# Patient Record
Sex: Female | Born: 1960 | Race: White | Hispanic: No | Marital: Single | Smoking: Current every day smoker
Health system: Southern US, Community
[De-identification: ages and names within clinical notes are randomized; demographics above are authoritative.]

---

## 2009-06-18 ENCOUNTER — Emergency Department (HOSPITAL_COMMUNITY): Admission: EM | Admit: 2009-06-18 | Discharge: 2009-06-19 | Payer: Self-pay | Admitting: Emergency Medicine

## 2009-07-11 ENCOUNTER — Encounter: Admission: RE | Admit: 2009-07-11 | Discharge: 2009-10-09 | Payer: Self-pay | Admitting: Orthopaedic Surgery

## 2016-02-07 ENCOUNTER — Encounter (HOSPITAL_COMMUNITY): Payer: Self-pay | Admitting: *Deleted

## 2016-02-07 ENCOUNTER — Emergency Department (HOSPITAL_COMMUNITY): Payer: BLUE CROSS/BLUE SHIELD

## 2016-02-07 ENCOUNTER — Emergency Department (HOSPITAL_COMMUNITY)
Admission: EM | Admit: 2016-02-07 | Discharge: 2016-02-07 | Disposition: A | Discharge status: Home / Self Care | Payer: BLUE CROSS/BLUE SHIELD | Attending: Emergency Medicine | Admitting: Emergency Medicine

## 2016-02-07 DIAGNOSIS — S42294A Other nondisplaced fracture of upper end of right humerus, initial encounter for closed fracture: Secondary | ICD-10-CM | POA: Insufficient documentation

## 2016-02-07 DIAGNOSIS — Y92009 Unspecified place in unspecified non-institutional (private) residence as the place of occurrence of the external cause: Secondary | ICD-10-CM | POA: Insufficient documentation

## 2016-02-07 DIAGNOSIS — Y999 Unspecified external cause status: Secondary | ICD-10-CM | POA: Insufficient documentation

## 2016-02-07 DIAGNOSIS — F17200 Nicotine dependence, unspecified, uncomplicated: Secondary | ICD-10-CM | POA: Insufficient documentation

## 2016-02-07 DIAGNOSIS — W010XXA Fall on same level from slipping, tripping and stumbling without subsequent striking against object, initial encounter: Secondary | ICD-10-CM | POA: Diagnosis not present

## 2016-02-07 DIAGNOSIS — S42201A Unspecified fracture of upper end of right humerus, initial encounter for closed fracture: Secondary | ICD-10-CM

## 2016-02-07 DIAGNOSIS — Z79899 Other long term (current) drug therapy: Secondary | ICD-10-CM | POA: Diagnosis not present

## 2016-02-07 DIAGNOSIS — S4991XA Unspecified injury of right shoulder and upper arm, initial encounter: Secondary | ICD-10-CM | POA: Diagnosis present

## 2016-02-07 DIAGNOSIS — Y939 Activity, unspecified: Secondary | ICD-10-CM | POA: Diagnosis not present

## 2016-02-07 MED ORDER — OXYCODONE-ACETAMINOPHEN 5-325 MG PO TABS
1.0000 | ORAL_TABLET | Freq: Once | ORAL | Status: AC
  Administered 2016-02-07: 1 via ORAL
  Filled 2016-02-07: qty 1

## 2016-02-07 MED ORDER — OXYCODONE-ACETAMINOPHEN 5-325 MG PO TABS: 1.0000 | ORAL_TABLET | ORAL | 0 refills | Status: AC | PRN

## 2016-02-07 NOTE — ED Triage Notes (Signed)
Pt c/o right shoulder pain after falling last night on the ice. Denies hitting her head. Pt feels as if her right shoulder has "popped out".

## 2016-02-07 NOTE — ED Provider Notes (Signed)
[facility]-EM[facility]ENCY DEPT Provider Note   CSN: [id number] Arrival date & time: 02/07/16  1215     History   Chief Complaint Chief Complaint  Patient presents with  . Shoulder Injury    HPI Maria Walter is a 56 y.o. female.  She is here for evaluation of the right shoulder injury, which occurred last night. She describes slipping onset no, falling against a car with an isolated injury to her right shoulder. She denies headache, neck pain, back pain, paresthesias, weakness or dizziness. There are no other known modifying factors.  HPI  History reviewed. No pertinent past medical history.  There are no active problems to display for this patient.   Past Surgical History:  Procedure Laterality Date  . CESAREAN SECTION      OB History    No data available       Home Medications    Prior to Admission medications   Medication Sig Start Date End Date Taking? Authorizing Provider  acetaminophen (TYLENOL) 500 MG tablet Take 1,000 mg by mouth every 6 (six) hours as needed for moderate pain or fever.   Yes Historical Provider, MD  Ascorbic Acid (VITAMIN C PO) Take 1 tablet by mouth daily.   Yes Historical Provider, MD  glucosamine-chondroitin 500-400 MG tablet Take 1 tablet by mouth daily.   Yes Historical Provider, MD  ibuprofen (ADVIL,MOTRIN) 200 MG tablet Take 400-800 mg by mouth every 6 (six) hours as needed for moderate pain.   Yes Historical Provider, MD  VITAMIN E PO Take 1 tablet by mouth daily.   Yes Historical Provider, MD  oxyCODONE-acetaminophen (PERCOCET) 5-325 MG tablet Take 1 tablet by mouth every 4 (four) hours as needed for severe pain. 02/07/16   Mancel Bale, MD    Family History No family history on file.  Social History Social History  Substance Use Topics  . Smoking status: Current Every Day Smoker    Packs/day: 1.00  . Smokeless tobacco: Never Used  . Alcohol use Yes     Comment: couple glasses of wine weely      Allergies    Penicillins   Review of Systems Review of Systems  All other systems reviewed and are negative.    Physical Exam Updated Vital Signs BP (!) 182/103 (BP Location: Left Arm)   Pulse 80   Temp 98.5 F (36.9 C) (Oral)   Resp 24   Ht 5\' 1"  (1.549 m)   Wt 158 lb (71.7 kg)   SpO2 98%   BMI 29.85 kg/m   Physical Exam  Constitutional: She is oriented to person, place, and time. She appears well-developed and well-nourished. No distress.  HENT:  Head: Normocephalic and atraumatic.  Eyes: Conjunctivae and EOM are normal. Pupils are equal, round, and reactive to light.  Neck: Normal range of motion and phonation normal. Neck supple.  Cardiovascular: Normal rate.   Pulmonary/Chest: Effort normal.  Musculoskeletal:  Right shoulder tender without deformity. Neurovascular intact distally in the right hand.  Neurological: She is alert and oriented to person, place, and time. She exhibits normal muscle tone.  Skin: Skin is warm and dry.  Psychiatric: She has a normal mood and affect. Her behavior is normal. Judgment and thought content normal.  Nursing note and vitals reviewed.    ED Treatments / Results  Labs (all labs ordered are listed, but only abnormal results are displayed) Labs Reviewed - No data to display  EKG  EKG Interpretation None       Radiology Dg  Shoulder Right  Result Date: 02/07/2016 CLINICAL DATA:  Pt states she fell last night at home/pain upper right shoulder EXAM: RIGHT SHOULDER - 2+ VIEW COMPARISON:  None. FINDINGS: There is a fracture the proximal humerus. There is a transverse fracture across metaphysis with a secondary fracture across the base of the greater tuberosity. There is no significant fracture displacement or angulation. Glenohumeral and AC joints are normally aligned. IMPRESSION: Mildly comminuted, nondisplaced and nonangulated fracture of the proximal right humerus. No dislocation. Electronically Signed   By: Amie Portlandavid  Ormond M.D.   On:  02/07/2016 13:10    Procedures Procedures (including critical care time)  Medications Ordered in ED Medications  oxyCODONE-acetaminophen (PERCOCET/ROXICET) 5-325 MG per tablet 1 tablet (not administered)     Initial Impression / Assessment and Plan / ED Course  I have reviewed the triage vital signs and the nursing notes.  Pertinent labs & imaging results that were available during my care of the patient were reviewed by me and considered in my medical decision making (see chart for details).     Medications  oxyCODONE-acetaminophen (PERCOCET/ROXICET) 5-325 MG per tablet 1 tablet (1 tablet Oral Given 02/07/16 1417)    Patient Vitals for the past 24 hrs:  BP Temp Temp src Pulse Resp SpO2 Height Weight  02/07/16 1245 (!) 182/103 98.5 F (36.9 C) Oral 80 24 98 % 5\' 1"  (1.549 m) 158 lb (71.7 kg)    2:17 PM Reevaluation with update and discussion. After initial assessment and treatment, an updated evaluation reveals No change in clinical status. Findings discussed with the patient and all questions were answered. Carlus Stay L    Final Clinical Impressions(s) / ED Diagnoses   Final diagnoses:  Closed fracture of proximal end of right humerus, initial encounter    Slip and fall with isolated injury to proximal humerus. She has a nonoperative humerus fracture. Doubt occult head or spine injury.  Nursing Notes Reviewed/ Care Coordinated Applicable Imaging Reviewed Interpretation of Laboratory Data incorporated into ED treatment  The patient appears reasonably screened and/or stabilized for discharge and I doubt any other medical condition or other [facility] requiring further screening, evaluation, or treatment in the ED at this time prior to discharge.  Plan: Home Medications- continue; Home Treatments- rest; return here if the recommended treatment, does not improve the symptoms; Recommended follow up- PCP prn   New Prescriptions New Prescriptions   OXYCODONE-ACETAMINOPHEN  (PERCOCET) 5-325 MG TABLET    Take 1 tablet by mouth every 4 (four) hours as needed for severe pain.     Mancel BaleElliott Kataleya Zaugg, MD 02/07/16 [phone]

## 2016-02-07 NOTE — Discharge Instructions (Signed)
Wear the sling immobilizer for comfort.  Use ice on the sore area 3 or 4 times a day.  Follow-up with the orthopedist for a checkup, next week.

## 2016-02-07 NOTE — ED Notes (Signed)
Take to xray

## 2018-05-05 IMAGING — DX DG SHOULDER 2+V*R*
2 series · 2 of 2 positions shown · non-contrast
Comparison: None.

CLINICAL DATA: Pt states she fell last night at home/pain upper
right shoulder

EXAM:
RIGHT SHOULDER - 2+ VIEW

[shoulder grashey]
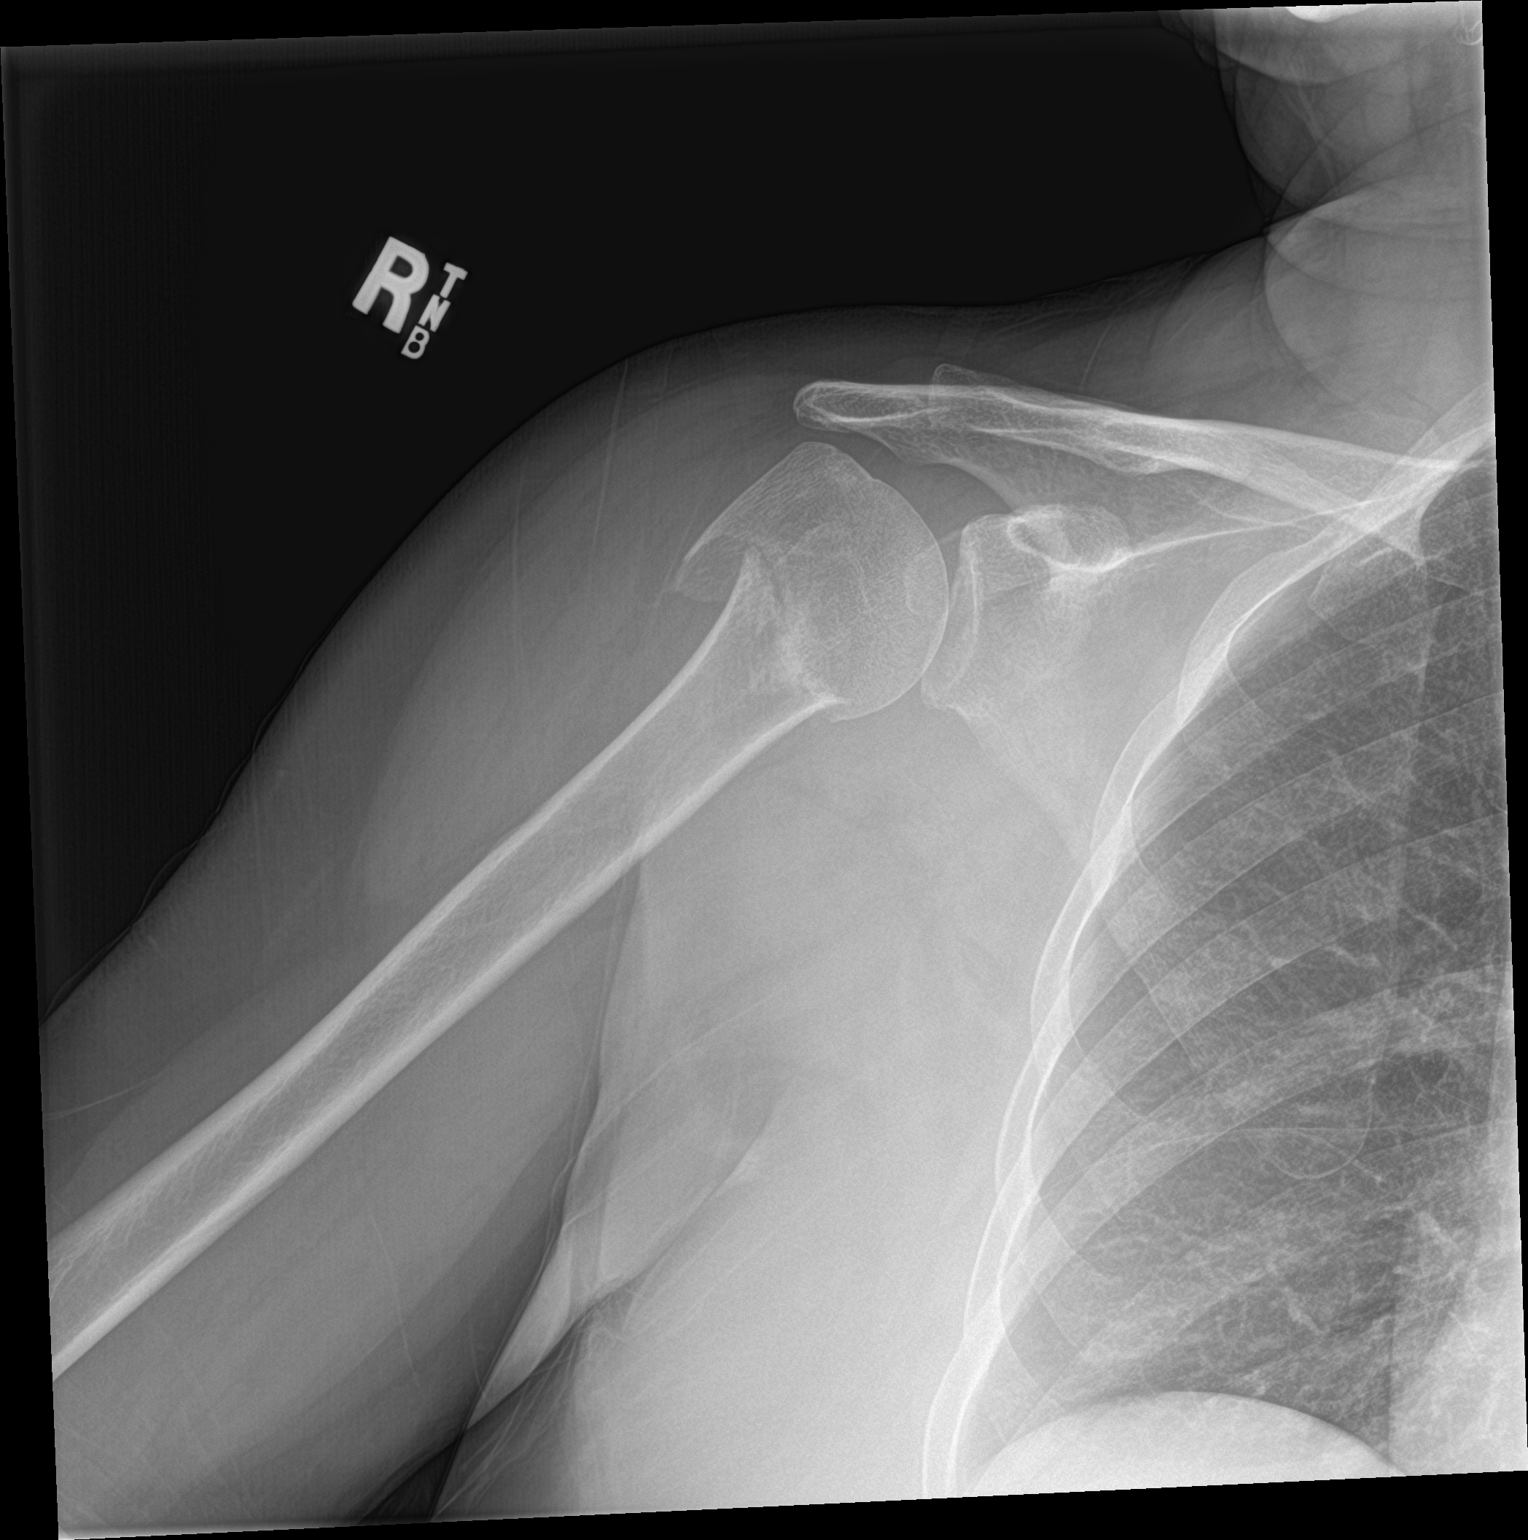

[shoulder y view]
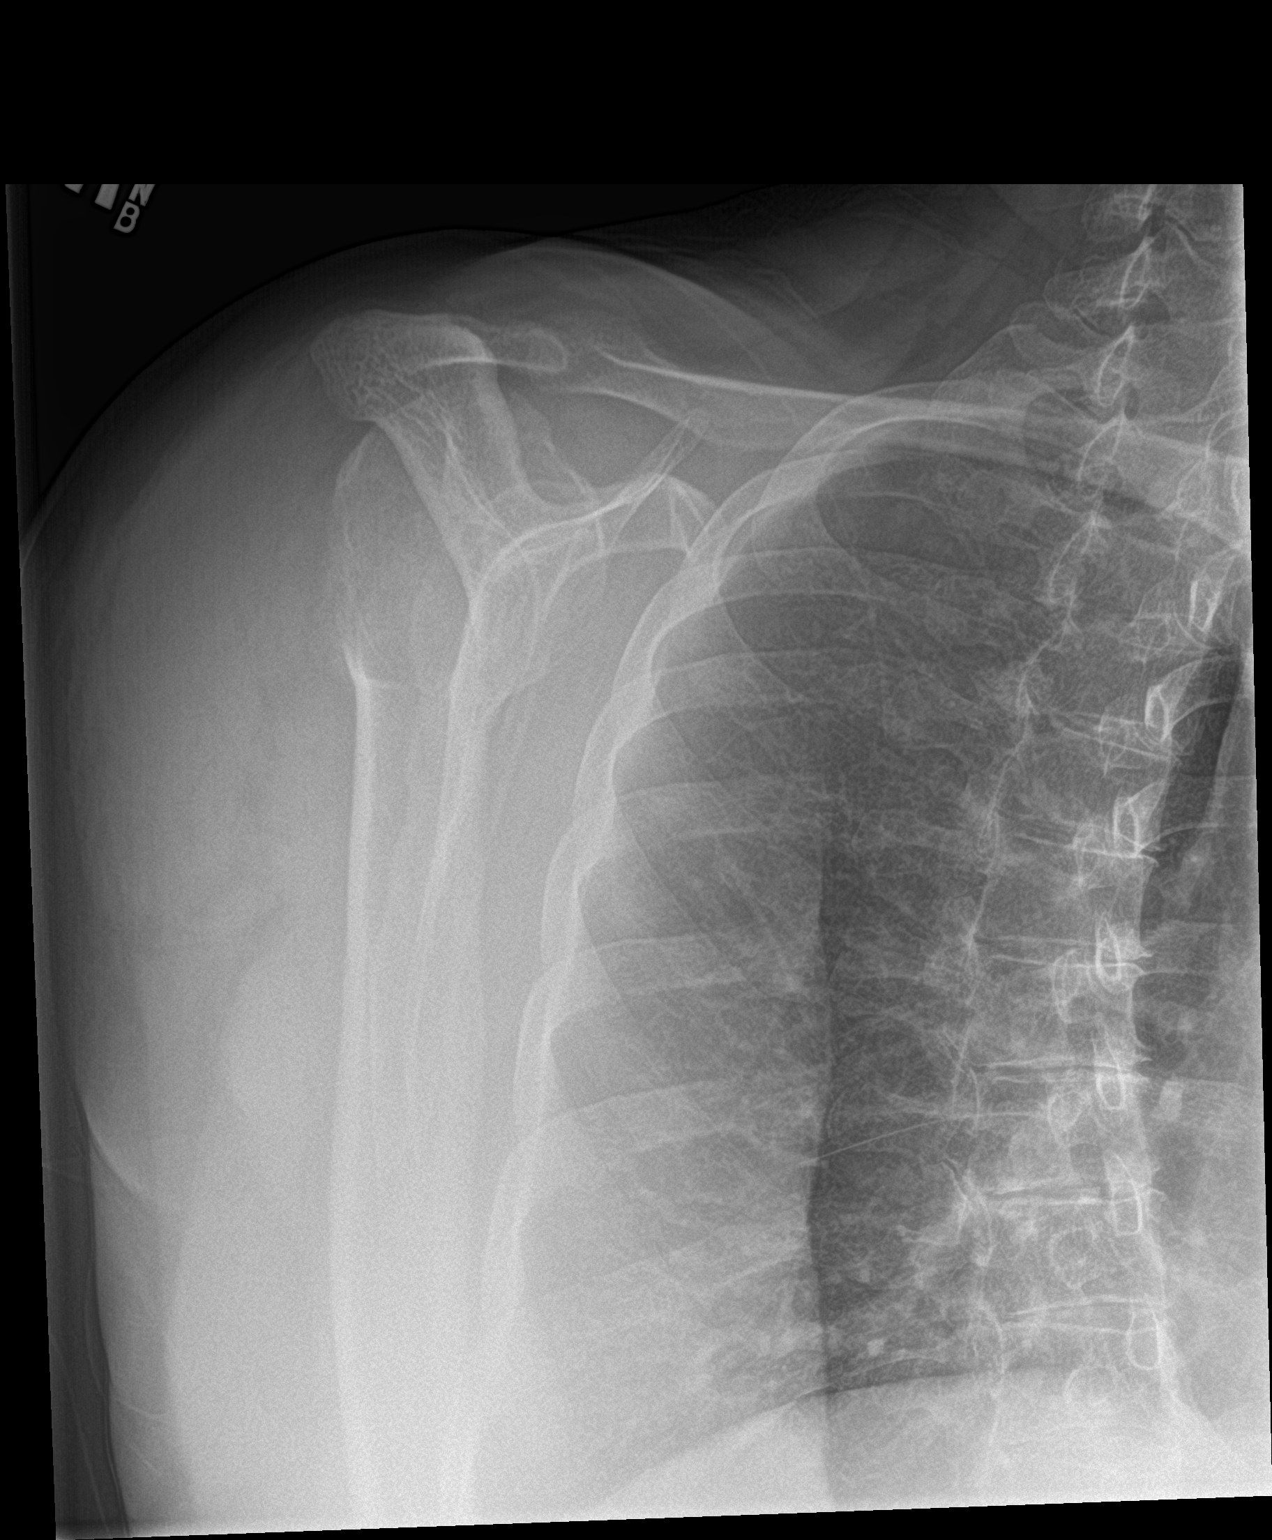

[2 of 2 positions shown; findings below may reference images not displayed]

FINDINGS: There is a fracture the proximal humerus. There is a transverse
fracture across metaphysis with a secondary fracture across the base
of the greater tuberosity. There is no significant fracture
displacement or angulation.

Glenohumeral and AC joints are normally aligned.
IMPRESSION: Mildly comminuted, nondisplaced and nonangulated fracture of the
proximal right humerus. No dislocation.

## 2022-02-12 DIAGNOSIS — F32A Depression, unspecified: Secondary | ICD-10-CM | POA: Diagnosis not present

## 2022-02-12 DIAGNOSIS — M1990 Unspecified osteoarthritis, unspecified site: Secondary | ICD-10-CM | POA: Diagnosis not present

## 2022-02-12 DIAGNOSIS — F419 Anxiety disorder, unspecified: Secondary | ICD-10-CM | POA: Diagnosis not present

## 2022-02-12 DIAGNOSIS — I10 Essential (primary) hypertension: Secondary | ICD-10-CM | POA: Diagnosis not present

## 2022-02-13 DIAGNOSIS — I10 Essential (primary) hypertension: Secondary | ICD-10-CM | POA: Diagnosis not present

## 2022-03-05 DIAGNOSIS — L819 Disorder of pigmentation, unspecified: Secondary | ICD-10-CM | POA: Diagnosis not present

## 2022-03-05 DIAGNOSIS — R002 Palpitations: Secondary | ICD-10-CM | POA: Diagnosis not present

## 2023-02-05 DIAGNOSIS — Z0000 Encounter for general adult medical examination without abnormal findings: Secondary | ICD-10-CM | POA: Diagnosis not present

## 2023-02-05 DIAGNOSIS — G4709 Other insomnia: Secondary | ICD-10-CM | POA: Diagnosis not present

## 2023-02-05 DIAGNOSIS — B3731 Acute candidiasis of vulva and vagina: Secondary | ICD-10-CM | POA: Diagnosis not present

## 2023-02-05 DIAGNOSIS — Z13220 Encounter for screening for lipoid disorders: Secondary | ICD-10-CM | POA: Diagnosis not present

## 2023-04-21 DIAGNOSIS — I10 Essential (primary) hypertension: Secondary | ICD-10-CM | POA: Diagnosis not present

## 2023-04-21 DIAGNOSIS — Z720 Tobacco use: Secondary | ICD-10-CM | POA: Diagnosis not present

## 2023-04-21 DIAGNOSIS — R0989 Other specified symptoms and signs involving the circulatory and respiratory systems: Secondary | ICD-10-CM | POA: Diagnosis not present

## 2023-09-22 DIAGNOSIS — Z720 Tobacco use: Secondary | ICD-10-CM | POA: Diagnosis not present

## 2023-09-22 DIAGNOSIS — M25512 Pain in left shoulder: Secondary | ICD-10-CM | POA: Diagnosis not present

## 2023-09-22 DIAGNOSIS — R42 Dizziness and giddiness: Secondary | ICD-10-CM | POA: Diagnosis not present

## 2023-09-22 DIAGNOSIS — R000 Tachycardia, unspecified: Secondary | ICD-10-CM | POA: Diagnosis not present

## 2023-09-22 DIAGNOSIS — I10 Essential (primary) hypertension: Secondary | ICD-10-CM | POA: Diagnosis not present
